# Patient Record
Sex: Male | Born: 1965 | Race: Black or African American | Hispanic: No | Marital: Married | Smoking: Never smoker
Health system: Southern US, Community
[De-identification: ages and names within clinical notes are randomized; demographics above are authoritative.]

---

## 2009-05-21 ENCOUNTER — Emergency Department (HOSPITAL_COMMUNITY): Admission: EM | Admit: 2009-05-21 | Discharge: 2009-05-21 | Payer: Self-pay | Admitting: Family Medicine

## 2011-03-02 IMAGING — CR DG FOOT COMPLETE 3+V*L*
3 series · 3 of 3 positions shown · non-contrast
Comparison: None

CLINICAL DATA: Left foot pain

LEFT FOOT - COMPLETE 3+ VIEW

[view not recorded (1 of 3)]
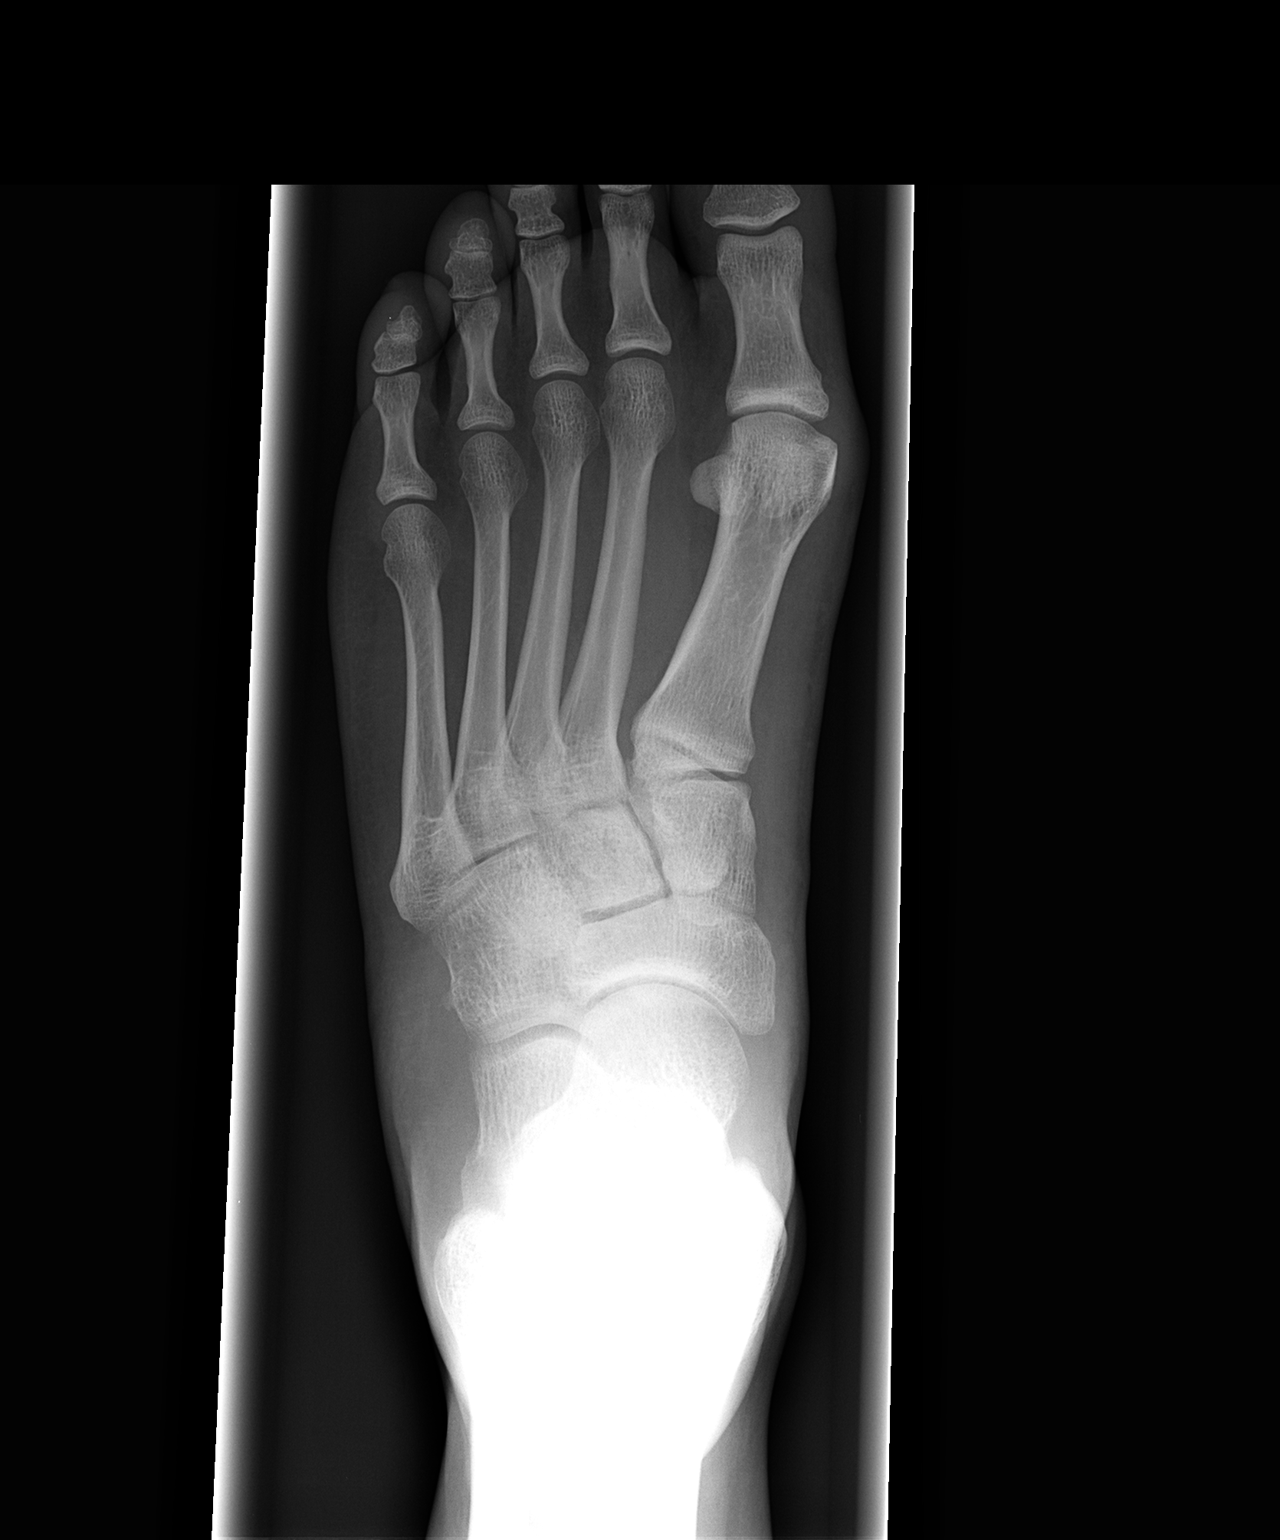

[view not recorded (2 of 3)]
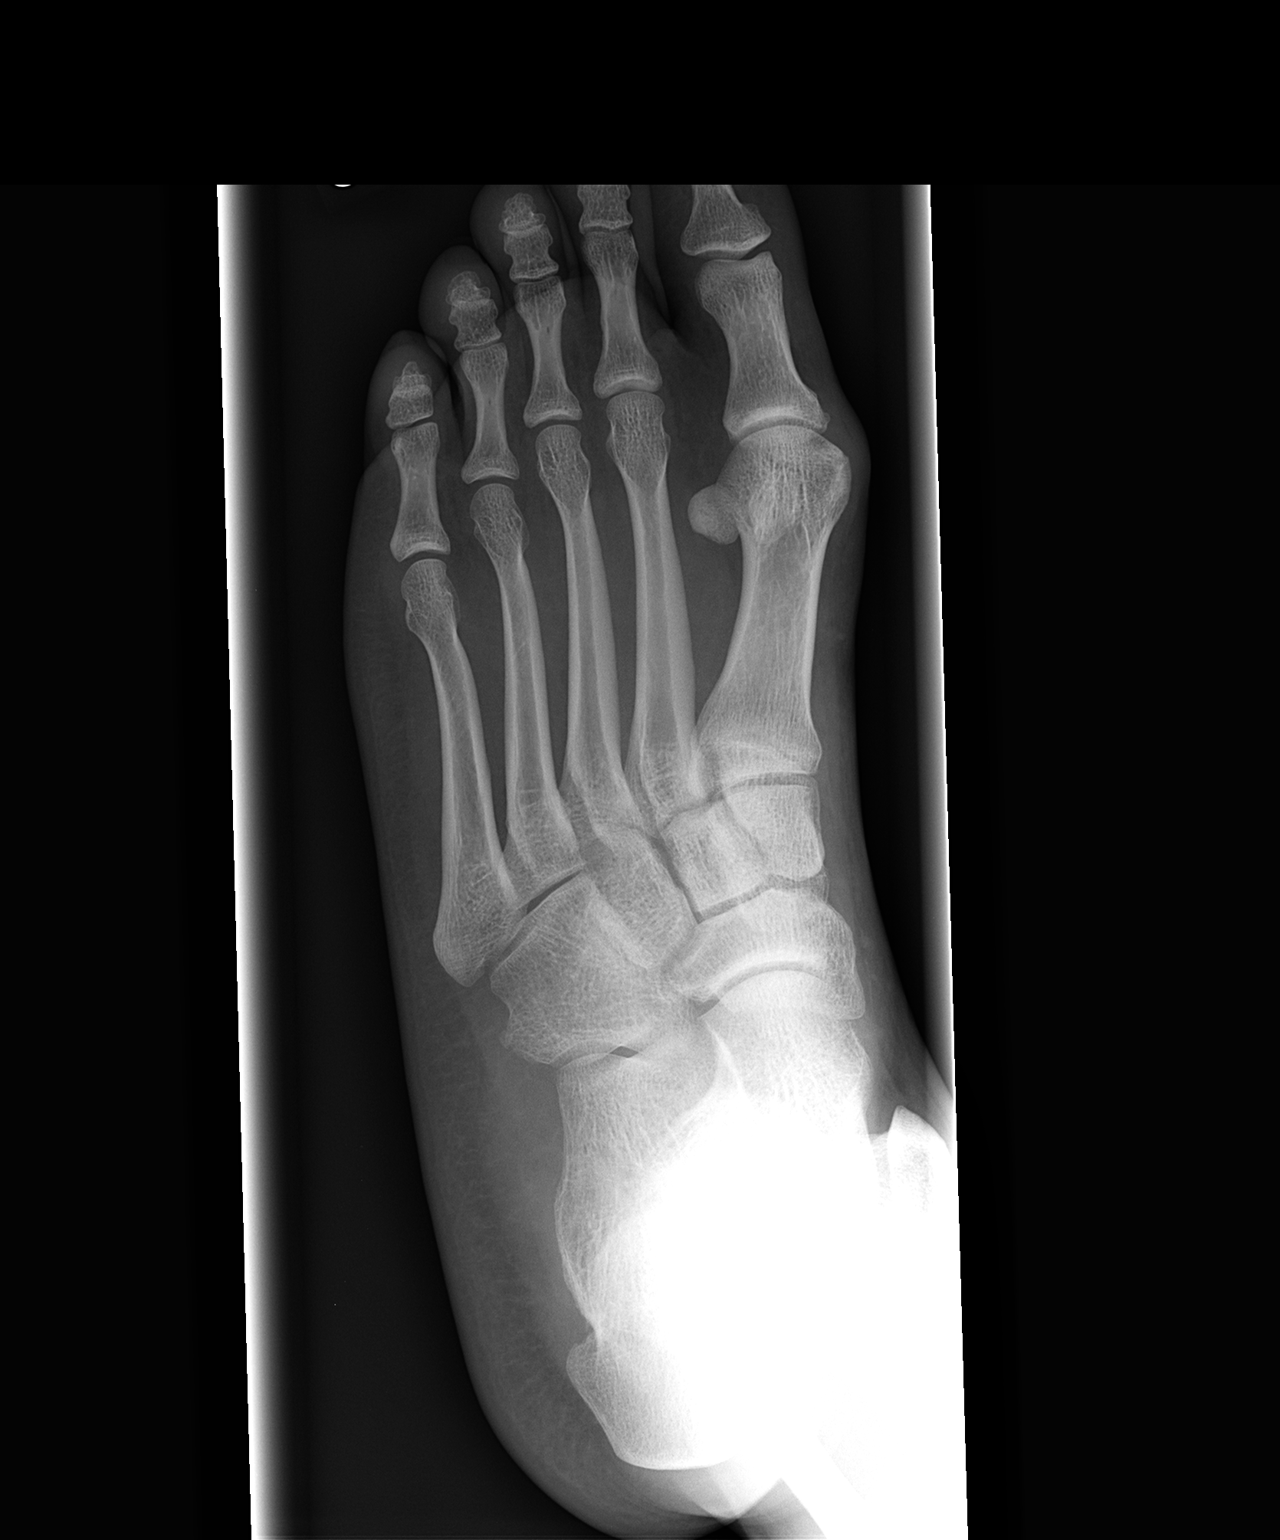

[view not recorded (3 of 3)]
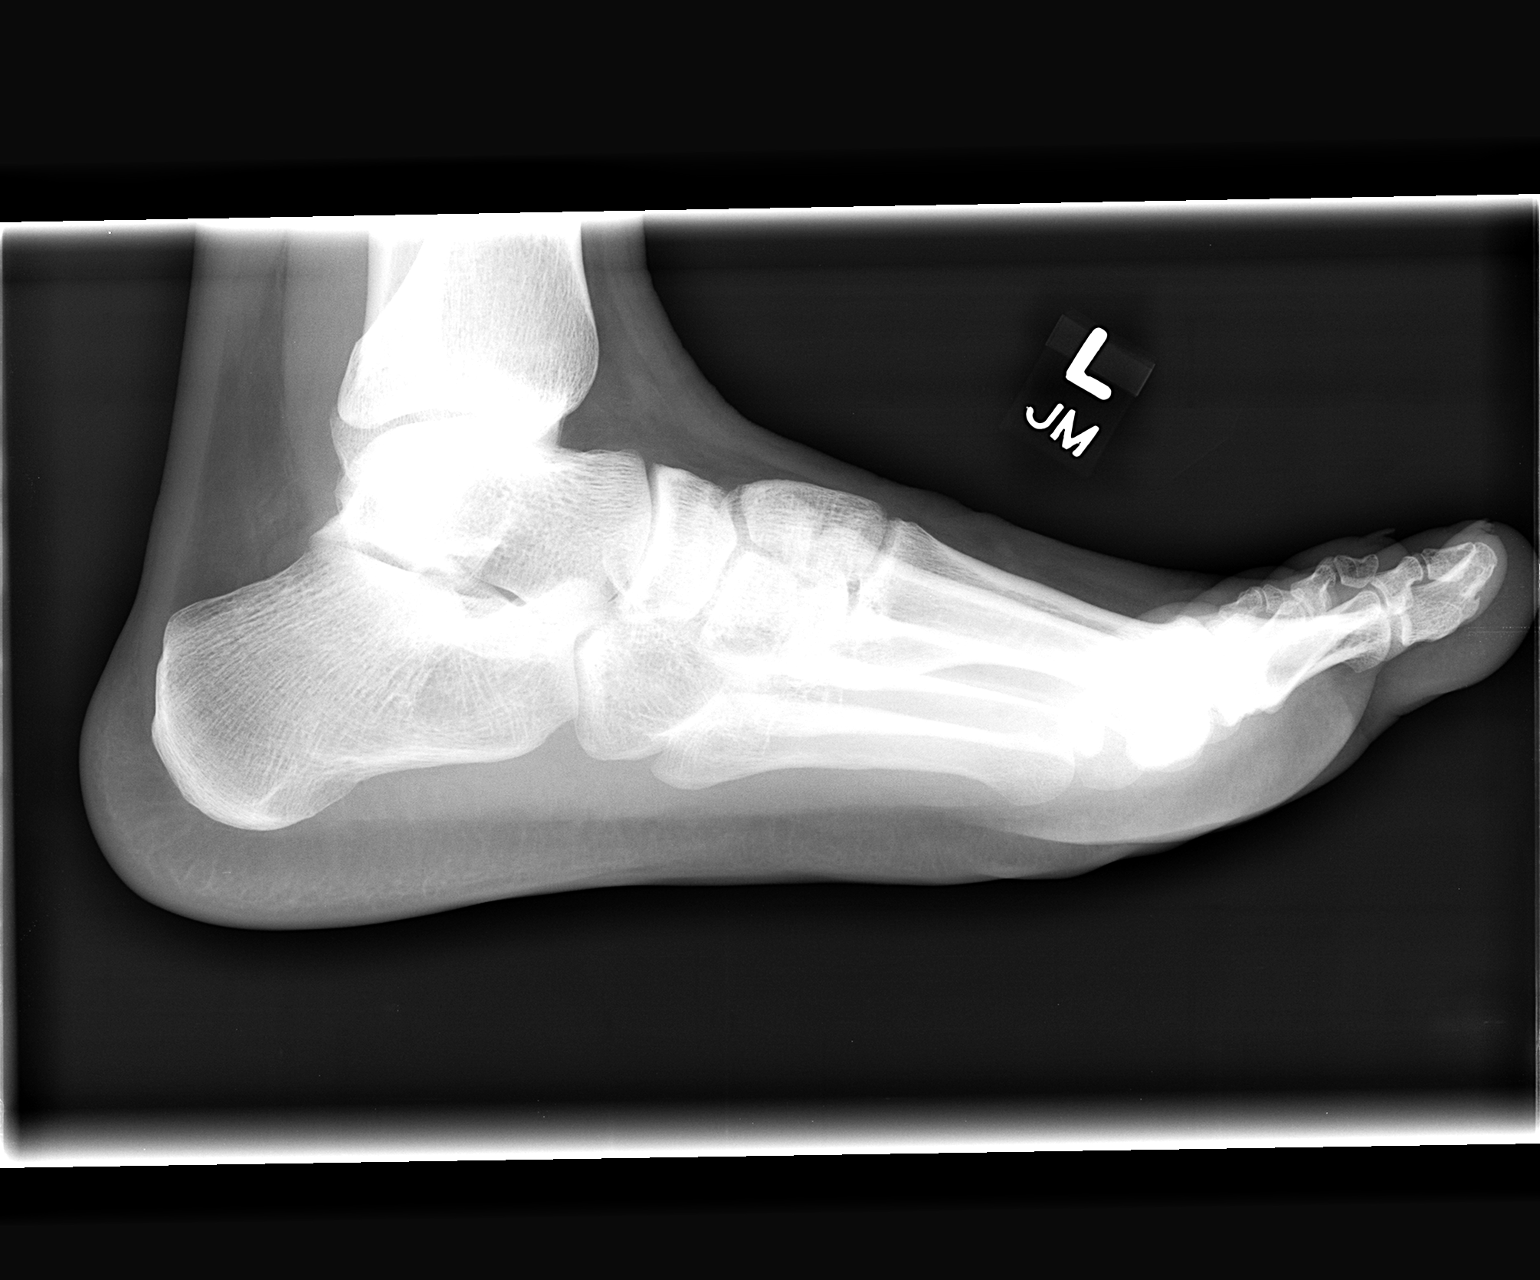

[3 of 3 positions shown; findings below may reference images not displayed]

FINDINGS: Bone mineralization normal.
Joint spaces preserved.
No acute fracture, dislocation, or bone destruction.
IMPRESSION: Normal exam.

## 2017-03-08 ENCOUNTER — Ambulatory Visit (HOSPITAL_COMMUNITY)
Admission: EM | Admit: 2017-03-08 | Discharge: 2017-03-08 | Disposition: A | Discharge status: Home / Self Care | Payer: BLUE CROSS/BLUE SHIELD | Attending: Internal Medicine | Admitting: Internal Medicine

## 2017-03-08 ENCOUNTER — Ambulatory Visit (INDEPENDENT_AMBULATORY_CARE_PROVIDER_SITE_OTHER): Payer: BLUE CROSS/BLUE SHIELD

## 2017-03-08 ENCOUNTER — Encounter (HOSPITAL_COMMUNITY): Payer: Self-pay | Admitting: Emergency Medicine

## 2017-03-08 DIAGNOSIS — M79644 Pain in right finger(s): Secondary | ICD-10-CM

## 2017-03-08 NOTE — ED Provider Notes (Signed)
[facility]    CSN: [id number] Arrival date & time: 03/08/17  1601     History   Chief Complaint Chief Complaint  Patient presents with  . Finger Injury    HPI Derek Vazquez is a 52 y.o. male.   52 year old male comes in for continued right thumb pain after injury 2 weeks ago.  He states he is out of town, states 2 weeks ago he assumed he sprained his thumb by accidentally jamming finger.  He continued ice compress, NSAIDs intermittently, wrist splint.  He recently took off the wrist splint to test for movement, and noticed continued pain of the right thumb, and wanted to come in for an x-ray.  States painful movement.  Denies numbness, tingling.      History reviewed. No pertinent past medical history.  There are no active problems to display for this patient.   History reviewed. No pertinent surgical history.     Home Medications    Prior to Admission medications   Not on File    Family History No family history on file.  Social History Social History   Tobacco Use  . Smoking status: Never Smoker  . Smokeless tobacco: Never Used  Substance Use Topics  . Alcohol use: Yes    Comment: occ.   . Drug use: Not on file     Allergies   Amoxicillin   Review of Systems Review of Systems  Reason unable to perform ROS: See HPI as above.     Physical Exam Triage Vital Signs ED Triage Vitals  Enc Vitals Group     BP 03/08/17 1643 128/83     Pulse Rate 03/08/17 1641 80     Resp 03/08/17 1641 16     Temp 03/08/17 1641 98.4 F (36.9 C)     Temp Source 03/08/17 1641 Oral     SpO2 03/08/17 1641 100 %     Weight 03/08/17 1640 168 lb (76.2 kg)     Height 03/08/17 1640 5\' 10"  (1.778 m)     Head Circumference --      Peak Flow --      Pain Score 03/08/17 1640 5     Pain Loc --      Pain Edu? --      Excl. in GC? --    No data found.  Updated Vital Signs BP 128/83   Pulse 80   Temp 98.4 F (36.9 C) (Oral)   Resp 16   Ht 5\' 10"  (1.778  m)   Wt 168 lb (76.2 kg)   SpO2 100%   BMI 24.11 kg/m   Physical Exam  Constitutional: He is oriented to person, place, and time. He appears well-developed and well-nourished. No distress.  HENT:  Head: Normocephalic and atraumatic.  Eyes: Conjunctivae are normal. Pupils are equal, round, and reactive to light.  Musculoskeletal:  No obvious swelling, contusions, erythema.  Tenderness on palpation of right first MCP. No tenderness on palpation of the wrist.  Full range of motion of the fingers.  Strength normal and equal bilaterally.  Radial pulse 2+ and equal bilaterally.  Sensation intact, cap refill less than 2 seconds.  Neurological: He is alert and oriented to person, place, and time.     UC Treatments / Results  Labs (all labs ordered are listed, but only abnormal results are displayed) Labs Reviewed - No data to display  EKG  EKG Interpretation None       Radiology Dg Finger Thumb Right  Result Date: 03/08/2017 CLINICAL DATA:  Jamming injury to right thumb.  Pain. EXAM: RIGHT THUMB 2+V COMPARISON:  None. FINDINGS: There is no evidence of fracture or dislocation. There is no evidence of arthropathy or other focal bone abnormality. Soft tissues are unremarkable IMPRESSION: Negative. Electronically Signed   By: Kennith CenterEric  Mansell M.D.   On: 03/08/2017 17:23    Procedures Procedures (including critical care time)  Medications Ordered in UC Medications - No data to display   Initial Impression / Assessment and Plan / UC Course  I have reviewed the triage vital signs and the nursing notes.  Pertinent labs & imaging results that were available during my care of the patient were reviewed by me and considered in my medical decision making (see chart for details).    Patient with attempts for conservative treatment with continued right thumb pain, requesting x-ray.   X-ray negative for fracture or dislocation.  Continue NSAIDs, ice compress,  thumb spica.  Discussed with  patient this can take a few weeks to completely resolve, but should be feeling better each week.  Follow-up with PCP for further evaluation and treatment needed.  Patient expresses understanding and agrees to plan.  Final Clinical Impressions(s) / UC Diagnoses   Final diagnoses:  Pain of right thumb    ED Discharge Orders    None        Belinda FisherYu, Riddhi Grether V, PA-C 03/08/17 1732

## 2017-03-08 NOTE — ED Triage Notes (Signed)
PT reports he sprained right thumb 2 weeks ago. PT has immobilized it and treated with NSAIDs for two weeks. PT reports he would like an xray due to shooting pain in affected finger. PT assumed it was a sprain when it occurred.

## 2017-03-08 NOTE — Discharge Instructions (Signed)
X-ray negative for fracture or dislocation.  Continue antiinflammatories for the next 10 days. You can take naproxen 440mg  twice a day, or ibuprofen 800 mg three times a day. Continue ice compress, wrist brace during activity.  This can take up to 3-4 weeks to completely resolve, but should be feeling better each week.  Follow-up with PCP for further evaluation if symptoms not improving.

## 2018-12-18 IMAGING — DX DG FINGER THUMB 2+V*R*
3 series · 3 of 3 positions shown · non-contrast
Comparison: None.

CLINICAL DATA: Jamming injury to right thumb.  Pain.

EXAM:
RIGHT THUMB 2+V

[finger ap]
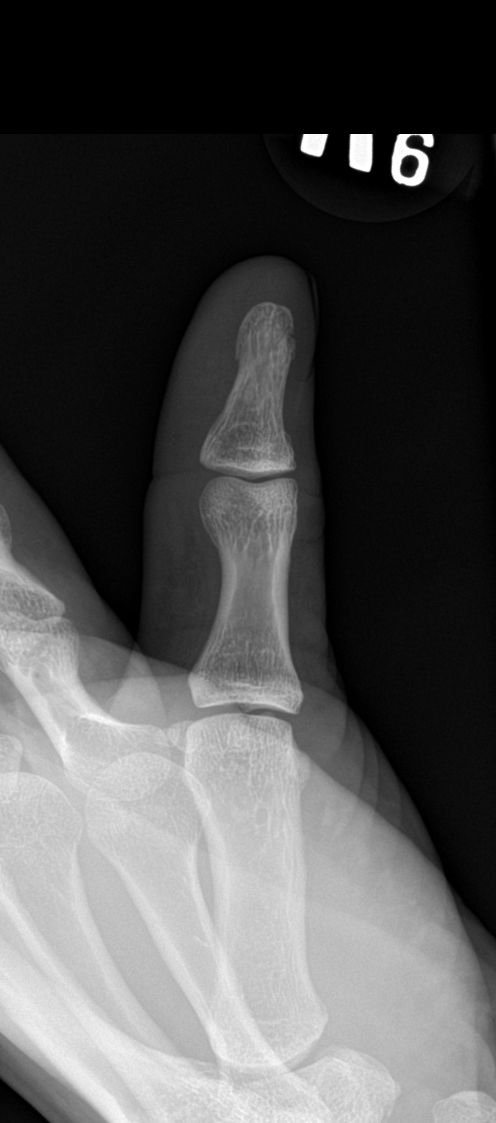

[finger obl]
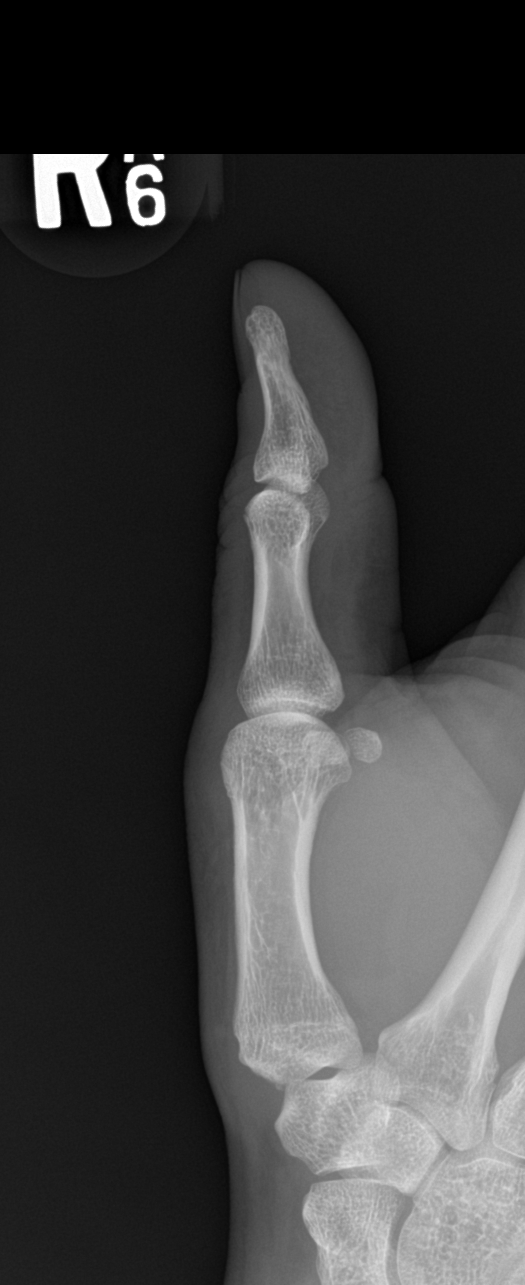

[finger lat]
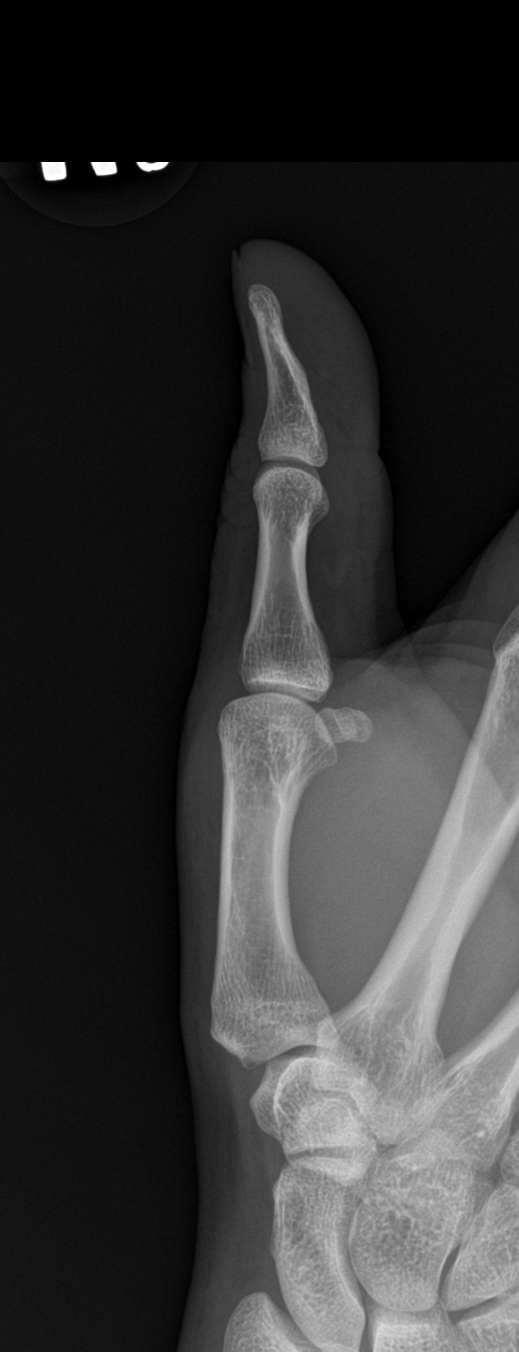

[3 of 3 positions shown; findings below may reference images not displayed]

FINDINGS: There is no evidence of fracture or dislocation. There is no
evidence of arthropathy or other focal bone abnormality. Soft
tissues are unremarkable
IMPRESSION: Negative.
